# Patient Record
Sex: Female | Born: 1985 | Race: Black or African American | Hispanic: No | Marital: Single | Smoking: Never smoker
Health system: Southern US, Community
[De-identification: ages and names within clinical notes are randomized; demographics above are authoritative.]

---
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
## 2003-06-06 ENCOUNTER — Other Ambulatory Visit: Admission: RE | Admit: 2003-06-06 | Discharge: 2003-06-06 | Payer: Self-pay

## 2006-12-20 ENCOUNTER — Emergency Department (HOSPITAL_COMMUNITY): Admission: EM | Admit: 2006-12-20 | Discharge: 2006-12-20 | Payer: Self-pay | Admitting: Emergency Medicine

## 2010-09-28 ENCOUNTER — Emergency Department (HOSPITAL_COMMUNITY)
Admission: EM | Admit: 2010-09-28 | Discharge: 2010-09-28 | Disposition: A | Discharge status: Home / Self Care | Payer: 59 | Attending: Emergency Medicine | Admitting: Emergency Medicine

## 2010-09-28 DIAGNOSIS — R112 Nausea with vomiting, unspecified: Secondary | ICD-10-CM | POA: Insufficient documentation

## 2010-09-28 DIAGNOSIS — R197 Diarrhea, unspecified: Secondary | ICD-10-CM | POA: Insufficient documentation

## 2010-09-28 LAB — URINALYSIS, ROUTINE W REFLEX MICROSCOPIC
Leukocytes, UA: NEGATIVE
Nitrite: NEGATIVE
Specific Gravity, Urine: 1.029 (ref 1.005–1.030)
Urobilinogen, UA: 0.2 mg/dL (ref 0.0–1.0)
pH: 6 (ref 5.0–8.0)

## 2010-09-28 LAB — URINE MICROSCOPIC-ADD ON

## 2010-09-28 LAB — POCT PREGNANCY, URINE: Preg Test, Ur: NEGATIVE

## 2015-09-26 DIAGNOSIS — Z30018 Encounter for initial prescription of other contraceptives: Secondary | ICD-10-CM | POA: Diagnosis not present

## 2015-09-26 DIAGNOSIS — Z01411 Encounter for gynecological examination (general) (routine) with abnormal findings: Secondary | ICD-10-CM | POA: Diagnosis not present

## 2015-09-26 DIAGNOSIS — B373 Candidiasis of vulva and vagina: Secondary | ICD-10-CM | POA: Diagnosis not present

## 2015-12-18 DIAGNOSIS — B373 Candidiasis of vulva and vagina: Secondary | ICD-10-CM | POA: Diagnosis not present

## 2016-10-01 DIAGNOSIS — Z01411 Encounter for gynecological examination (general) (routine) with abnormal findings: Secondary | ICD-10-CM | POA: Diagnosis not present

## 2016-10-01 DIAGNOSIS — Z3009 Encounter for other general counseling and advice on contraception: Secondary | ICD-10-CM | POA: Diagnosis not present

## 2016-12-11 ENCOUNTER — Encounter (HOSPITAL_COMMUNITY): Payer: Self-pay

## 2016-12-11 ENCOUNTER — Emergency Department (HOSPITAL_COMMUNITY)
Admission: EM | Admit: 2016-12-11 | Discharge: 2016-12-11 | Disposition: A | Discharge status: Home / Self Care | Payer: BLUE CROSS/BLUE SHIELD | Attending: Emergency Medicine | Admitting: Emergency Medicine

## 2016-12-11 DIAGNOSIS — R51 Headache: Secondary | ICD-10-CM | POA: Diagnosis not present

## 2016-12-11 DIAGNOSIS — R519 Headache, unspecified: Secondary | ICD-10-CM

## 2016-12-11 MED ORDER — CYCLOBENZAPRINE HCL 10 MG PO TABS: 10.0000 mg | ORAL_TABLET | Freq: Two times a day (BID) | ORAL | 0 refills | Status: AC | PRN

## 2016-12-11 MED ORDER — BUTALBITAL-APAP-CAFFEINE 50-325-40 MG PO TABS: 1.0000 | ORAL_TABLET | Freq: Four times a day (QID) | ORAL | 0 refills | Status: AC | PRN

## 2016-12-11 NOTE — ED Provider Notes (Signed)
[facility]-EMERGENCY DEPT Provider Note   CSN: [id number] Arrival date & time: 12/11/16  2028     History   Chief Complaint Chief Complaint  Patient presents with  . Migraine    HPI Elizabeth Howe is a 31 y.o. female.  HPI   31 year old healthy female presenting complaining of headache. Patient reports gradual onset of intermittent episodic headache ongoing for the past 2 weeks. She describes headache as a tension tightness sensation involving her head and neck waxing waning every 10 minutes. She endorse some light sensitivity and occasional spots in her vision. She has been taking ibuprofen with some relief. She reported having some cold symptoms 2 weeks prior but that has since resolved. She denies any increasing stress, no change in her diet, does drink caffeine on a regular basis, denies having fever, ear pain, sore throat, trouble swallowing, chest pain,  trouble breathing, or rash. Headache is currently rated as 5 out of 10. Denies alcohol use or drug use. No recent trauma.     No past medical history on file.  There are no active problems to display for this patient.   History reviewed. No pertinent surgical history.  OB History    No data available       Home Medications    Prior to Admission medications   Medication Sig Start Date End Date Taking? Authorizing Provider  ibuprofen (ADVIL,MOTRIN) 200 MG tablet Take 400 mg by mouth every 6 (six) hours as needed for moderate pain.   Yes [provider]    Family History History reviewed. No pertinent family history.  Social History Social History  Substance Use Topics  . Smoking status: Never Smoker  . Smokeless tobacco: Never Used  . Alcohol use No     Allergies   Patient has no known allergies.   Review of Systems Review of Systems  All other systems reviewed and are negative.    Physical Exam Updated Vital Signs BP (!) 154/76 (BP Location: Left Arm)   Pulse 73   Temp 98.9 F (37.2 C)  (Oral)   Resp 20   Ht 5\' 11"  (1.803 m)   Wt 96.6 kg (213 lb)   LMP 12/11/2016 (Exact Date)   SpO2 100%   BMI 29.71 kg/m   Physical Exam  Constitutional: She is oriented to person, place, and time. She appears well-developed and well-nourished. No distress.  HENT:  Head: Atraumatic.  Right Ear: External ear normal.  Left Ear: External ear normal.  Nose: Nose normal.  Mouth/Throat: Oropharynx is clear and moist.  Eyes: Pupils are equal, round, and reactive to light. Conjunctivae and EOM are normal.  Neck: Normal range of motion. Neck supple.  No nuchal rigidity  Cardiovascular: Normal rate and regular rhythm.   Pulmonary/Chest: Effort normal.  Neurological: She is alert and oriented to person, place, and time. She has normal strength. No cranial nerve deficit or sensory deficit. She displays a negative Romberg sign. GCS eye subscore is 4. GCS verbal subscore is 5. GCS motor subscore is 6.  Skin: No rash noted.  Psychiatric: She has a normal mood and affect.  Nursing note and vitals reviewed.    ED Treatments / Results  Labs (all labs ordered are listed, but only abnormal results are displayed) Labs Reviewed - No data to display  EKG  EKG Interpretation None       Radiology No results found.  Procedures Procedures (including critical care time)  Medications Ordered in ED Medications - No data to  display   Initial Impression / Assessment and Plan / ED Course  I have reviewed the triage vital signs and the nursing notes.  Pertinent labs & imaging results that were available during my care of the patient were reviewed by me and considered in my medical decision making (see chart for details).     BP (!) 154/76 (BP Location: Left Arm)   Pulse 73   Temp 98.9 F (37.2 C) (Oral)   Resp 20   Ht 5\' 11"  (1.803 m)   Wt 96.6 kg (213 lb)   LMP 12/11/2016 (Exact Date)   SpO2 100%   BMI 29.71 kg/m    Final Clinical Impressions(s) / ED Diagnoses   Final  diagnoses:  Bad headache    New Prescriptions New Prescriptions   No medications on file   10:14 PM Patient has recurrent headache. No concerning feature and no red flags. She is well-appearing, afebrile. Mildly elevated blood pressure of 154 was 6, recommend recheck by PCP. Otherwise I have low suspicion for meningitis, subarachnoid hemorrhage, or mass occupying lesion. Patient discharged with muscle relaxant, and Fioricet. Return precaution discussed.   Fayrene Helperran, Camiyah Friberg, PA-C 12/11/16 2217    Mancel BaleWentz, [place], MD 12/13/16 [phone]

## 2016-12-11 NOTE — ED Triage Notes (Signed)
Pt is alert and oriented and states that she has had a HA/Migraine x 1 month 7/10 and os described as pressure/squeezing that radiates to her neck. And pressure behind her eyes no blurred vision  . Pt reports taking Ibuprofen for pain but was not effective.

## 2016-12-16 DIAGNOSIS — M79631 Pain in right forearm: Secondary | ICD-10-CM | POA: Diagnosis not present

## 2016-12-16 DIAGNOSIS — M25512 Pain in left shoulder: Secondary | ICD-10-CM | POA: Diagnosis not present

## 2016-12-16 DIAGNOSIS — R079 Chest pain, unspecified: Secondary | ICD-10-CM | POA: Diagnosis not present

## 2016-12-16 DIAGNOSIS — S134XXA Sprain of ligaments of cervical spine, initial encounter: Secondary | ICD-10-CM | POA: Diagnosis not present

## 2016-12-16 DIAGNOSIS — T148XXA Other injury of unspecified body region, initial encounter: Secondary | ICD-10-CM | POA: Diagnosis not present

## 2016-12-16 DIAGNOSIS — S50811A Abrasion of right forearm, initial encounter: Secondary | ICD-10-CM | POA: Diagnosis not present

## 2016-12-16 DIAGNOSIS — M25531 Pain in right wrist: Secondary | ICD-10-CM | POA: Diagnosis not present

## 2016-12-24 DIAGNOSIS — M62838 Other muscle spasm: Secondary | ICD-10-CM | POA: Diagnosis not present

## 2016-12-25 ENCOUNTER — Ambulatory Visit
Admission: RE | Admit: 2016-12-25 | Discharge: 2016-12-25 | Disposition: A | Discharge status: Home / Self Care | Payer: BLUE CROSS/BLUE SHIELD | Source: Ambulatory Visit | Attending: Family Medicine | Admitting: Family Medicine

## 2016-12-25 ENCOUNTER — Other Ambulatory Visit: Payer: Self-pay | Admitting: Family Medicine

## 2016-12-25 DIAGNOSIS — M542 Cervicalgia: Secondary | ICD-10-CM | POA: Diagnosis not present

## 2016-12-25 DIAGNOSIS — S199XXA Unspecified injury of neck, initial encounter: Secondary | ICD-10-CM | POA: Diagnosis not present

## 2017-01-02 DIAGNOSIS — M545 Low back pain: Secondary | ICD-10-CM | POA: Diagnosis not present

## 2017-01-02 DIAGNOSIS — M542 Cervicalgia: Secondary | ICD-10-CM | POA: Diagnosis not present

## 2017-01-05 DIAGNOSIS — M545 Low back pain: Secondary | ICD-10-CM | POA: Diagnosis not present

## 2017-01-05 DIAGNOSIS — M542 Cervicalgia: Secondary | ICD-10-CM | POA: Diagnosis not present

## 2017-01-07 DIAGNOSIS — M542 Cervicalgia: Secondary | ICD-10-CM | POA: Diagnosis not present

## 2017-01-07 DIAGNOSIS — M545 Low back pain: Secondary | ICD-10-CM | POA: Diagnosis not present

## 2017-01-14 DIAGNOSIS — M545 Low back pain: Secondary | ICD-10-CM | POA: Diagnosis not present

## 2017-01-14 DIAGNOSIS — R51 Headache: Secondary | ICD-10-CM | POA: Diagnosis not present

## 2017-01-14 DIAGNOSIS — M542 Cervicalgia: Secondary | ICD-10-CM | POA: Diagnosis not present

## 2017-01-21 DIAGNOSIS — M545 Low back pain: Secondary | ICD-10-CM | POA: Diagnosis not present

## 2017-01-21 DIAGNOSIS — M542 Cervicalgia: Secondary | ICD-10-CM | POA: Diagnosis not present

## 2017-01-29 DIAGNOSIS — M542 Cervicalgia: Secondary | ICD-10-CM | POA: Diagnosis not present

## 2017-01-29 DIAGNOSIS — M545 Low back pain: Secondary | ICD-10-CM | POA: Diagnosis not present

## 2017-02-05 DIAGNOSIS — L03019 Cellulitis of unspecified finger: Secondary | ICD-10-CM | POA: Diagnosis not present

## 2017-02-12 DIAGNOSIS — M542 Cervicalgia: Secondary | ICD-10-CM | POA: Diagnosis not present

## 2017-02-12 DIAGNOSIS — G43009 Migraine without aura, not intractable, without status migrainosus: Secondary | ICD-10-CM | POA: Diagnosis not present

## 2017-02-13 DIAGNOSIS — M791 Myalgia: Secondary | ICD-10-CM | POA: Diagnosis not present

## 2017-02-13 DIAGNOSIS — M9900 Segmental and somatic dysfunction of head region: Secondary | ICD-10-CM | POA: Diagnosis not present

## 2017-02-13 DIAGNOSIS — R51 Headache: Secondary | ICD-10-CM | POA: Diagnosis not present

## 2017-02-13 DIAGNOSIS — M9901 Segmental and somatic dysfunction of cervical region: Secondary | ICD-10-CM | POA: Diagnosis not present

## 2017-02-16 DIAGNOSIS — R51 Headache: Secondary | ICD-10-CM | POA: Diagnosis not present

## 2017-02-16 DIAGNOSIS — M9900 Segmental and somatic dysfunction of head region: Secondary | ICD-10-CM | POA: Diagnosis not present

## 2017-02-16 DIAGNOSIS — M9901 Segmental and somatic dysfunction of cervical region: Secondary | ICD-10-CM | POA: Diagnosis not present

## 2017-02-16 DIAGNOSIS — M9902 Segmental and somatic dysfunction of thoracic region: Secondary | ICD-10-CM | POA: Diagnosis not present

## 2017-02-23 DIAGNOSIS — M9900 Segmental and somatic dysfunction of head region: Secondary | ICD-10-CM | POA: Diagnosis not present

## 2017-02-23 DIAGNOSIS — R51 Headache: Secondary | ICD-10-CM | POA: Diagnosis not present

## 2017-02-23 DIAGNOSIS — M9901 Segmental and somatic dysfunction of cervical region: Secondary | ICD-10-CM | POA: Diagnosis not present

## 2017-02-23 DIAGNOSIS — M9902 Segmental and somatic dysfunction of thoracic region: Secondary | ICD-10-CM | POA: Diagnosis not present

## 2017-02-24 DIAGNOSIS — R51 Headache: Secondary | ICD-10-CM | POA: Diagnosis not present

## 2017-02-24 DIAGNOSIS — G43009 Migraine without aura, not intractable, without status migrainosus: Secondary | ICD-10-CM | POA: Diagnosis not present

## 2017-02-24 DIAGNOSIS — G935 Compression of brain: Secondary | ICD-10-CM | POA: Diagnosis not present

## 2017-02-24 DIAGNOSIS — J320 Chronic maxillary sinusitis: Secondary | ICD-10-CM | POA: Diagnosis not present

## 2017-02-26 DIAGNOSIS — R51 Headache: Secondary | ICD-10-CM | POA: Diagnosis not present

## 2017-02-26 DIAGNOSIS — M9901 Segmental and somatic dysfunction of cervical region: Secondary | ICD-10-CM | POA: Diagnosis not present

## 2017-02-26 DIAGNOSIS — M9900 Segmental and somatic dysfunction of head region: Secondary | ICD-10-CM | POA: Diagnosis not present

## 2017-02-26 DIAGNOSIS — M9902 Segmental and somatic dysfunction of thoracic region: Secondary | ICD-10-CM | POA: Diagnosis not present

## 2017-03-02 DIAGNOSIS — M5481 Occipital neuralgia: Secondary | ICD-10-CM | POA: Diagnosis not present

## 2017-03-02 DIAGNOSIS — M791 Myalgia, unspecified site: Secondary | ICD-10-CM | POA: Diagnosis not present

## 2017-03-02 DIAGNOSIS — M542 Cervicalgia: Secondary | ICD-10-CM | POA: Diagnosis not present

## 2017-03-02 DIAGNOSIS — R51 Headache: Secondary | ICD-10-CM | POA: Diagnosis not present

## 2017-03-03 DIAGNOSIS — M9902 Segmental and somatic dysfunction of thoracic region: Secondary | ICD-10-CM | POA: Diagnosis not present

## 2017-03-03 DIAGNOSIS — R51 Headache: Secondary | ICD-10-CM | POA: Diagnosis not present

## 2017-03-03 DIAGNOSIS — M9900 Segmental and somatic dysfunction of head region: Secondary | ICD-10-CM | POA: Diagnosis not present

## 2017-03-03 DIAGNOSIS — M9901 Segmental and somatic dysfunction of cervical region: Secondary | ICD-10-CM | POA: Diagnosis not present

## 2017-03-04 DIAGNOSIS — M9900 Segmental and somatic dysfunction of head region: Secondary | ICD-10-CM | POA: Diagnosis not present

## 2017-03-04 DIAGNOSIS — M9901 Segmental and somatic dysfunction of cervical region: Secondary | ICD-10-CM | POA: Diagnosis not present

## 2017-03-04 DIAGNOSIS — R51 Headache: Secondary | ICD-10-CM | POA: Diagnosis not present

## 2017-03-04 DIAGNOSIS — M9902 Segmental and somatic dysfunction of thoracic region: Secondary | ICD-10-CM | POA: Diagnosis not present

## 2017-03-05 DIAGNOSIS — M9901 Segmental and somatic dysfunction of cervical region: Secondary | ICD-10-CM | POA: Diagnosis not present

## 2017-03-05 DIAGNOSIS — M9900 Segmental and somatic dysfunction of head region: Secondary | ICD-10-CM | POA: Diagnosis not present

## 2017-03-05 DIAGNOSIS — R51 Headache: Secondary | ICD-10-CM | POA: Diagnosis not present

## 2017-03-05 DIAGNOSIS — M9902 Segmental and somatic dysfunction of thoracic region: Secondary | ICD-10-CM | POA: Diagnosis not present

## 2017-03-09 DIAGNOSIS — M9902 Segmental and somatic dysfunction of thoracic region: Secondary | ICD-10-CM | POA: Diagnosis not present

## 2017-03-09 DIAGNOSIS — M9900 Segmental and somatic dysfunction of head region: Secondary | ICD-10-CM | POA: Diagnosis not present

## 2017-03-09 DIAGNOSIS — R51 Headache: Secondary | ICD-10-CM | POA: Diagnosis not present

## 2017-03-09 DIAGNOSIS — M9901 Segmental and somatic dysfunction of cervical region: Secondary | ICD-10-CM | POA: Diagnosis not present

## 2017-03-11 DIAGNOSIS — M9900 Segmental and somatic dysfunction of head region: Secondary | ICD-10-CM | POA: Diagnosis not present

## 2017-03-11 DIAGNOSIS — M9901 Segmental and somatic dysfunction of cervical region: Secondary | ICD-10-CM | POA: Diagnosis not present

## 2017-03-11 DIAGNOSIS — M9902 Segmental and somatic dysfunction of thoracic region: Secondary | ICD-10-CM | POA: Diagnosis not present

## 2017-03-11 DIAGNOSIS — R51 Headache: Secondary | ICD-10-CM | POA: Diagnosis not present

## 2017-03-13 DIAGNOSIS — G9389 Other specified disorders of brain: Secondary | ICD-10-CM | POA: Diagnosis not present

## 2017-03-13 DIAGNOSIS — I9589 Other hypotension: Secondary | ICD-10-CM | POA: Diagnosis not present

## 2017-03-16 DIAGNOSIS — M9901 Segmental and somatic dysfunction of cervical region: Secondary | ICD-10-CM | POA: Diagnosis not present

## 2017-03-16 DIAGNOSIS — R51 Headache: Secondary | ICD-10-CM | POA: Diagnosis not present

## 2017-03-16 DIAGNOSIS — M9900 Segmental and somatic dysfunction of head region: Secondary | ICD-10-CM | POA: Diagnosis not present

## 2017-03-16 DIAGNOSIS — M9902 Segmental and somatic dysfunction of thoracic region: Secondary | ICD-10-CM | POA: Diagnosis not present

## 2017-03-18 DIAGNOSIS — M9900 Segmental and somatic dysfunction of head region: Secondary | ICD-10-CM | POA: Diagnosis not present

## 2017-03-18 DIAGNOSIS — M9902 Segmental and somatic dysfunction of thoracic region: Secondary | ICD-10-CM | POA: Diagnosis not present

## 2017-03-18 DIAGNOSIS — R51 Headache: Secondary | ICD-10-CM | POA: Diagnosis not present

## 2017-03-18 DIAGNOSIS — M9901 Segmental and somatic dysfunction of cervical region: Secondary | ICD-10-CM | POA: Diagnosis not present

## 2017-03-23 DIAGNOSIS — R51 Headache: Secondary | ICD-10-CM | POA: Diagnosis not present

## 2017-03-23 DIAGNOSIS — M9902 Segmental and somatic dysfunction of thoracic region: Secondary | ICD-10-CM | POA: Diagnosis not present

## 2017-03-23 DIAGNOSIS — M9900 Segmental and somatic dysfunction of head region: Secondary | ICD-10-CM | POA: Diagnosis not present

## 2017-03-23 DIAGNOSIS — M9901 Segmental and somatic dysfunction of cervical region: Secondary | ICD-10-CM | POA: Diagnosis not present

## 2017-03-30 DIAGNOSIS — M9901 Segmental and somatic dysfunction of cervical region: Secondary | ICD-10-CM | POA: Diagnosis not present

## 2017-03-30 DIAGNOSIS — R51 Headache: Secondary | ICD-10-CM | POA: Diagnosis not present

## 2017-03-30 DIAGNOSIS — M9902 Segmental and somatic dysfunction of thoracic region: Secondary | ICD-10-CM | POA: Diagnosis not present

## 2017-03-30 DIAGNOSIS — M9900 Segmental and somatic dysfunction of head region: Secondary | ICD-10-CM | POA: Diagnosis not present

## 2017-03-31 ENCOUNTER — Telehealth: Payer: Self-pay | Admitting: *Deleted

## 2017-03-31 ENCOUNTER — Ambulatory Visit: Payer: BLUE CROSS/BLUE SHIELD | Admitting: Neurology

## 2017-03-31 NOTE — Telephone Encounter (Signed)
No showed new patient appointment. 

## 2017-04-01 ENCOUNTER — Encounter: Payer: Self-pay | Admitting: Neurology

## 2017-04-01 DIAGNOSIS — M9900 Segmental and somatic dysfunction of head region: Secondary | ICD-10-CM | POA: Diagnosis not present

## 2017-04-01 DIAGNOSIS — R51 Headache: Secondary | ICD-10-CM | POA: Diagnosis not present

## 2017-04-01 DIAGNOSIS — M9901 Segmental and somatic dysfunction of cervical region: Secondary | ICD-10-CM | POA: Diagnosis not present

## 2017-04-01 DIAGNOSIS — M9902 Segmental and somatic dysfunction of thoracic region: Secondary | ICD-10-CM | POA: Diagnosis not present

## 2017-04-07 DIAGNOSIS — R51 Headache: Secondary | ICD-10-CM | POA: Diagnosis not present

## 2017-04-07 DIAGNOSIS — M62838 Other muscle spasm: Secondary | ICD-10-CM | POA: Diagnosis not present

## 2017-04-07 DIAGNOSIS — D649 Anemia, unspecified: Secondary | ICD-10-CM | POA: Diagnosis not present

## 2017-04-13 DIAGNOSIS — M9900 Segmental and somatic dysfunction of head region: Secondary | ICD-10-CM | POA: Diagnosis not present

## 2017-04-13 DIAGNOSIS — M9901 Segmental and somatic dysfunction of cervical region: Secondary | ICD-10-CM | POA: Diagnosis not present

## 2017-04-13 DIAGNOSIS — M9902 Segmental and somatic dysfunction of thoracic region: Secondary | ICD-10-CM | POA: Diagnosis not present

## 2017-04-13 DIAGNOSIS — R51 Headache: Secondary | ICD-10-CM | POA: Diagnosis not present

## 2017-04-15 DIAGNOSIS — M9902 Segmental and somatic dysfunction of thoracic region: Secondary | ICD-10-CM | POA: Diagnosis not present

## 2017-04-15 DIAGNOSIS — M9900 Segmental and somatic dysfunction of head region: Secondary | ICD-10-CM | POA: Diagnosis not present

## 2017-04-15 DIAGNOSIS — M9901 Segmental and somatic dysfunction of cervical region: Secondary | ICD-10-CM | POA: Diagnosis not present

## 2017-04-15 DIAGNOSIS — R51 Headache: Secondary | ICD-10-CM | POA: Diagnosis not present

## 2017-05-20 DIAGNOSIS — E669 Obesity, unspecified: Secondary | ICD-10-CM | POA: Diagnosis not present

## 2017-05-20 DIAGNOSIS — G43009 Migraine without aura, not intractable, without status migrainosus: Secondary | ICD-10-CM | POA: Diagnosis not present

## 2017-05-21 DIAGNOSIS — M62838 Other muscle spasm: Secondary | ICD-10-CM | POA: Diagnosis not present

## 2017-08-20 DIAGNOSIS — M62838 Other muscle spasm: Secondary | ICD-10-CM | POA: Diagnosis not present

## 2017-09-02 DIAGNOSIS — Z01411 Encounter for gynecological examination (general) (routine) with abnormal findings: Secondary | ICD-10-CM | POA: Diagnosis not present

## 2017-09-08 DIAGNOSIS — D259 Leiomyoma of uterus, unspecified: Secondary | ICD-10-CM | POA: Diagnosis not present

## 2017-09-08 DIAGNOSIS — N83292 Other ovarian cyst, left side: Secondary | ICD-10-CM | POA: Diagnosis not present

## 2017-09-22 DIAGNOSIS — G43009 Migraine without aura, not intractable, without status migrainosus: Secondary | ICD-10-CM | POA: Diagnosis not present

## 2017-12-03 DIAGNOSIS — D649 Anemia, unspecified: Secondary | ICD-10-CM | POA: Diagnosis not present

## 2017-12-03 DIAGNOSIS — Z6833 Body mass index (BMI) 33.0-33.9, adult: Secondary | ICD-10-CM | POA: Diagnosis not present

## 2017-12-03 DIAGNOSIS — M545 Low back pain: Secondary | ICD-10-CM | POA: Diagnosis not present

## 2018-03-23 DIAGNOSIS — G43009 Migraine without aura, not intractable, without status migrainosus: Secondary | ICD-10-CM | POA: Diagnosis not present
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
## 2018-04-30 DIAGNOSIS — R21 Rash and other nonspecific skin eruption: Secondary | ICD-10-CM | POA: Diagnosis not present

## 2018-08-09 DIAGNOSIS — Z7721 Contact with and (suspected) exposure to potentially hazardous body fluids: Secondary | ICD-10-CM | POA: Diagnosis not present

## 2018-09-21 DIAGNOSIS — G43009 Migraine without aura, not intractable, without status migrainosus: Secondary | ICD-10-CM | POA: Diagnosis not present

## 2019-01-11 DIAGNOSIS — Z6834 Body mass index (BMI) 34.0-34.9, adult: Secondary | ICD-10-CM | POA: Diagnosis not present

## 2019-01-11 DIAGNOSIS — Z1151 Encounter for screening for human papillomavirus (HPV): Secondary | ICD-10-CM | POA: Diagnosis not present

## 2019-01-11 DIAGNOSIS — Z01419 Encounter for gynecological examination (general) (routine) without abnormal findings: Secondary | ICD-10-CM | POA: Diagnosis not present

## 2019-03-10 ENCOUNTER — Other Ambulatory Visit: Payer: Self-pay | Admitting: Internal Medicine

## 2019-03-10 ENCOUNTER — Other Ambulatory Visit: Payer: Self-pay

## 2019-03-10 DIAGNOSIS — Z20822 Contact with and (suspected) exposure to covid-19: Secondary | ICD-10-CM

## 2019-03-12 LAB — NOVEL CORONAVIRUS, NAA: SARS-CoV-2, NAA: NOT DETECTED

## 2019-03-16 LAB — NOVEL CORONAVIRUS, NAA: SARS-CoV-2, NAA: NOT DETECTED

## 2019-08-09 DIAGNOSIS — N920 Excessive and frequent menstruation with regular cycle: Secondary | ICD-10-CM | POA: Diagnosis not present

## 2019-09-20 DIAGNOSIS — N920 Excessive and frequent menstruation with regular cycle: Secondary | ICD-10-CM | POA: Diagnosis not present

## 2019-10-03 DIAGNOSIS — N920 Excessive and frequent menstruation with regular cycle: Secondary | ICD-10-CM | POA: Diagnosis not present

## 2019-11-15 DIAGNOSIS — N840 Polyp of corpus uteri: Secondary | ICD-10-CM | POA: Diagnosis not present

## 2019-11-15 DIAGNOSIS — Z3202 Encounter for pregnancy test, result negative: Secondary | ICD-10-CM | POA: Diagnosis not present

## 2019-12-03 DIAGNOSIS — Z20822 Contact with and (suspected) exposure to covid-19: Secondary | ICD-10-CM | POA: Diagnosis not present

## 2019-12-03 DIAGNOSIS — Z03818 Encounter for observation for suspected exposure to other biological agents ruled out: Secondary | ICD-10-CM | POA: Diagnosis not present

## 2019-12-15 DIAGNOSIS — Z09 Encounter for follow-up examination after completed treatment for conditions other than malignant neoplasm: Secondary | ICD-10-CM | POA: Diagnosis not present

## 2020-02-21 DIAGNOSIS — Z30430 Encounter for insertion of intrauterine contraceptive device: Secondary | ICD-10-CM | POA: Diagnosis not present

## 2020-04-19 DIAGNOSIS — Z30431 Encounter for routine checking of intrauterine contraceptive device: Secondary | ICD-10-CM | POA: Diagnosis not present
